# Patient Record
Sex: Female | Born: 1990 | Race: White | Hispanic: No | Marital: Single | Smoking: Never smoker
Health system: Southern US, Community
[De-identification: ages and names within clinical notes are randomized; demographics above are authoritative.]

## PROBLEM LIST (undated history)

## (undated) DIAGNOSIS — G43909 Migraine, unspecified, not intractable, without status migrainosus: Secondary | ICD-10-CM

## (undated) DIAGNOSIS — F419 Anxiety disorder, unspecified: Secondary | ICD-10-CM

---

## 2011-05-10 ENCOUNTER — Inpatient Hospital Stay (HOSPITAL_COMMUNITY): Payer: Medicaid Other

## 2011-05-10 ENCOUNTER — Inpatient Hospital Stay (HOSPITAL_COMMUNITY)
Admission: AD | Admit: 2011-05-10 | Discharge: 2011-05-10 | Disposition: A | Discharge status: Home / Self Care | Payer: Medicaid Other | Source: Ambulatory Visit | Attending: Obstetrics and Gynecology | Admitting: Obstetrics and Gynecology

## 2011-05-10 DIAGNOSIS — O479 False labor, unspecified: Secondary | ICD-10-CM | POA: Insufficient documentation

## 2011-05-12 ENCOUNTER — Inpatient Hospital Stay (HOSPITAL_COMMUNITY)
Admission: AD | Admit: 2011-05-12 | Discharge: 2011-05-15 | DRG: 775 | Disposition: A | Discharge status: Home / Self Care | Payer: Medicaid Other | Source: Ambulatory Visit | Attending: Family Medicine | Admitting: Family Medicine

## 2011-05-12 DIAGNOSIS — O480 Post-term pregnancy: Principal | ICD-10-CM | POA: Diagnosis present

## 2011-05-12 LAB — CBC
MCH: 30.4 pg (ref 26.0–34.0)
MCHC: 34.1 g/dL (ref 30.0–36.0)
Platelets: 144 10*3/uL — ABNORMAL LOW (ref 150–400)

## 2011-05-12 LAB — RPR: RPR Ser Ql: NONREACTIVE

## 2011-05-13 DIAGNOSIS — O480 Post-term pregnancy: Secondary | ICD-10-CM

## 2011-05-14 NOTE — H&P (Signed)
  Meghan Cline, Meghan Cline NO.:  [id number]  MEDICAL RECORD NO.:  [id number]  LOCATION:                                 FACILITY:  PHYSICIAN:  Tilda Burrow, M.D. DATE OF BIRTH:  Dec 11, 1990  DATE OF ADMISSION: DATE OF DISCHARGE:                             HISTORY & PHYSICAL   ADMISSION DIAGNOSIS:  Pregnancy at 41 weeks 1 day, postdates pregnancy, medically indicated induction of labor.  HISTORY OF PRESENT ILLNESS:  This 20 year old female gravida 1, para 0 with ultrasound assigned EDC of May 04, 2011, is admitted at 41 weeks 1 day for induction of labor.  Prenatal course has been followed through [facility] since 20 weeks' gestation.  She has had appropriate weight gain and fundal height growth.  Prenatal course was notable for positive Chlamydia culture x2, treated x2.  The partner, Nell Range initially refused to get treatment because [facility].  He then had treatment and proof of cure cultures were negative.  They attended childbirth classes.  Remainder of prenatal labs include blood type O positive, antibody screen negative, rubella immune present, hemoglobin 11, hematocrit 34, platelets 162,000.  Hepatitis, HIV, RPR, GC and Chlamydia all negative. HSV-2 is negative.  Most recent Chlamydia culture negative.  Glucose tolerance test normal.  She plans to take the baby to Dr. Milford Cage, it is a female.  She plans to breastfeed.  Future contraception is not documented.  PAST MEDICAL HISTORY:  Positive for constipation, hemorrhoids.  PAST SURGICAL HISTORY:  Negative.  ALLERGIES:  None.  SOCIAL HISTORY:  Single, lives with her aunt unemployed.  PHYSICAL EXAMINATION:  GENERAL:  Shows a healthy-appearing Caucasian female in no acute distress. VITAL SIGNS:  Weight 158, which is 30-pound weight gain, blood pressure 130/82.  Urinalysis negative for protein, glucose.  Fundal height 38 cm.  Cervix posterior, fingertip,  dilated 50% within the posteriorly oriented cervix and -2 station.  PLAN:  Admit for cervical ripening, possible Foley bulb use, and subsequent Pitocin induction of labor.     Tilda Burrow, M.D.     JVF/MEDQ  D:  05/07/2011  T:  05/08/2011  Job:  [id number]  cc:   Francoise Schaumann. Raynelle Highland Fax: [phone]  Electronically Signed by Christin Bach M.D. on 05/14/2011 07:34:43 PM

## 2015-12-15 ENCOUNTER — Encounter (HOSPITAL_COMMUNITY): Payer: Self-pay | Admitting: Emergency Medicine

## 2015-12-15 ENCOUNTER — Emergency Department (HOSPITAL_COMMUNITY)
Admission: EM | Admit: 2015-12-15 | Discharge: 2015-12-16 | Disposition: A | Discharge status: Home / Self Care | Payer: Medicaid Other | Attending: Emergency Medicine | Admitting: Emergency Medicine

## 2015-12-15 DIAGNOSIS — R509 Fever, unspecified: Secondary | ICD-10-CM | POA: Insufficient documentation

## 2015-12-15 DIAGNOSIS — G43809 Other migraine, not intractable, without status migrainosus: Secondary | ICD-10-CM | POA: Insufficient documentation

## 2015-12-15 DIAGNOSIS — Z3202 Encounter for pregnancy test, result negative: Secondary | ICD-10-CM | POA: Insufficient documentation

## 2015-12-15 DIAGNOSIS — F410 Panic disorder [episodic paroxysmal anxiety] without agoraphobia: Secondary | ICD-10-CM | POA: Insufficient documentation

## 2015-12-15 HISTORY — DX: Migraine, unspecified, not intractable, without status migrainosus: G43.909

## 2015-12-15 HISTORY — DX: Anxiety disorder, unspecified: F41.9
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
## 2015-12-15 NOTE — ED Notes (Addendum)
Patient states "I have been getting a headache and it is affecting my vision, then I have anxiety set it for the past 3 weeks. [facility] but I don't like the way they do things." States she was given a prescription for fioricet with no relief.

## 2015-12-16 ENCOUNTER — Emergency Department (HOSPITAL_COMMUNITY): Payer: Medicaid Other

## 2015-12-16 LAB — CBC WITH DIFFERENTIAL/PLATELET
BASOS ABS: 0 10*3/uL (ref 0.0–0.1)
BASOS PCT: 0 %
EOS ABS: 0.2 10*3/uL (ref 0.0–0.7)
EOS PCT: 3 %
HCT: 40.2 % (ref 36.0–46.0)
Hemoglobin: 13.1 g/dL (ref 12.0–15.0)
LYMPHS PCT: 38 %
Lymphs Abs: 3.2 10*3/uL (ref 0.7–4.0)
MCH: 27.3 pg (ref 26.0–34.0)
MCHC: 32.6 g/dL (ref 30.0–36.0)
MCV: 83.9 fL (ref 78.0–100.0)
MONO ABS: 0.4 10*3/uL (ref 0.1–1.0)
Monocytes Relative: 5 %
Neutro Abs: 4.6 10*3/uL (ref 1.7–7.7)
Neutrophils Relative %: 54 %
PLATELETS: 216 10*3/uL (ref 150–400)
RBC: 4.79 MIL/uL (ref 3.87–5.11)
RDW: 13.7 % (ref 11.5–15.5)
WBC: 8.4 10*3/uL (ref 4.0–10.5)

## 2015-12-16 LAB — BASIC METABOLIC PANEL
ANION GAP: 7 (ref 5–15)
BUN: 8 mg/dL (ref 6–20)
CALCIUM: 9.6 mg/dL (ref 8.9–10.3)
CO2: 25 mmol/L (ref 22–32)
Chloride: 107 mmol/L (ref 101–111)
Creatinine, Ser: 0.54 mg/dL (ref 0.44–1.00)
GFR calc Af Amer: >60 mL/min (ref >60)
GLUCOSE: 97 mg/dL (ref 65–99)
Potassium: 4.6 mmol/L (ref 3.5–5.1)
Sodium: 139 mmol/L (ref 135–145)

## 2015-12-16 LAB — TROPONIN I: Troponin I: <0.03 ng/mL (ref <0.031)

## 2015-12-16 LAB — POC URINE PREG, ED: Preg Test, Ur: NEGATIVE

## 2015-12-16 MED ORDER — SODIUM CHLORIDE 0.9 % IV BOLUS (SEPSIS)
1000.0000 mL | Freq: Once | INTRAVENOUS | Status: AC
  Administered 2015-12-16: 1000 mL via INTRAVENOUS

## 2015-12-16 MED ORDER — PROMETHAZINE HCL 25 MG/ML IJ SOLN
25.0000 mg | Freq: Once | INTRAMUSCULAR | Status: AC
  Administered 2015-12-16: 25 mg via INTRAVENOUS
  Filled 2015-12-16: qty 1

## 2015-12-16 MED ORDER — KETOROLAC TROMETHAMINE 30 MG/ML IJ SOLN
30.0000 mg | Freq: Once | INTRAMUSCULAR | Status: AC
  Administered 2015-12-16: 30 mg via INTRAVENOUS
  Filled 2015-12-16: qty 1

## 2015-12-16 MED ORDER — DIPHENHYDRAMINE HCL 50 MG/ML IJ SOLN
25.0000 mg | Freq: Once | INTRAMUSCULAR | Status: AC
  Administered 2015-12-16: 25 mg via INTRAVENOUS
  Filled 2015-12-16: qty 1

## 2015-12-16 MED ORDER — PROMETHAZINE HCL 25 MG PO TABS
25.0000 mg | ORAL_TABLET | Freq: Four times a day (QID) | ORAL | Status: AC | PRN
Start: 2015-12-16 — End: ?

## 2015-12-16 NOTE — Discharge Instructions (Signed)
Migraine Headache A migraine headache is an intense, throbbing pain on one or both sides of your head. A migraine can last for 30 minutes to several hours. CAUSES  The exact cause of a migraine headache is not always known. However, a migraine may be caused when nerves in the brain become irritated and release chemicals that cause inflammation. This causes pain. Certain things may also trigger migraines, such as:  Alcohol.  Smoking.  Stress.  Menstruation.  Aged cheeses.  Foods or drinks that contain nitrates, glutamate, aspartame, or tyramine.  Lack of sleep.  Chocolate.  Caffeine.  Hunger.  Physical exertion.  Fatigue.  Medicines used to treat chest pain (nitroglycerine), birth control pills, estrogen, and some blood pressure medicines. SIGNS AND SYMPTOMS  Pain on one or both sides of your head.  Pulsating or throbbing pain.  Severe pain that prevents daily activities.  Pain that is aggravated by any physical activity.  Nausea, vomiting, or both.  Dizziness.  Pain with exposure to bright lights, loud noises, or activity.  General sensitivity to bright lights, loud noises, or smells. Before you get a migraine, you may get warning signs that a migraine is coming (aura). An aura may include:  Seeing flashing lights.  Seeing bright spots, halos, or zigzag lines.  Having tunnel vision or blurred vision.  Having feelings of numbness or tingling.  Having trouble talking.  Having muscle weakness. DIAGNOSIS  A migraine headache is often diagnosed based on:  Symptoms.  Physical exam.  A CT scan or MRI of your head. These imaging tests cannot diagnose migraines, but they can help rule out other causes of headaches. TREATMENT Medicines may be given for pain and nausea. Medicines can also be given to help prevent recurrent migraines.  HOME CARE INSTRUCTIONS  Only take over-the-counter or prescription medicines for pain or discomfort as directed by your  health care provider. The use of long-term narcotics is not recommended.  Lie down in a dark, quiet room when you have a migraine.  Keep a journal to find out what may trigger your migraine headaches. For example, write down:  What you eat and drink.  How much sleep you get.  Any change to your diet or medicines.  Limit alcohol consumption.  Quit smoking if you smoke.  Get 7-9 hours of sleep, or as recommended by your health care provider.  Limit stress.  Keep lights dim if bright lights bother you and make your migraines worse. SEEK IMMEDIATE MEDICAL CARE IF:   Your migraine becomes severe.  You have a fever.  You have a stiff neck.  You have vision loss.  You have muscular weakness or loss of muscle control.  You start losing your balance or have trouble walking.  You feel faint or pass out.  You have severe symptoms that are different from your first symptoms. MAKE SURE YOU:   Understand these instructions.  Will watch your condition.  Will get help right away if you are not doing well or get worse.   This information is not intended to replace advice given to you by your health care provider. Make sure you discuss any questions you have with your health care provider.   Document Released: 11/15/2005 Document Revised: 12/06/2014 Document Reviewed: 07/23/2013 Elsevier Interactive Patient Education 2016 Elsevier Inc.  Panic Attacks Panic attacks are sudden, short-livedsurges of severe anxiety, fear, or discomfort. They may occur for no reason when you are relaxed, when you are anxious, or when you are sleeping. Panic attacks  may occur for a number of reasons:   Healthy people occasionally have panic attacks in extreme, life-threatening situations, such as war or natural disasters. Normal anxiety is a protective mechanism of the body that helps us react to danger (fight or flight response).  Panic attacks are often seen with anxiety disorders, such as panic  disorder, social anxiety disorder, generalized anxiety disorder, and phobias. Anxiety disorders cause excessive or uncontrollable anxiety. They may interfere with your relationships or other life activities.  Panic attacks are sometimes seen with other mental illnesses, such as depression and posttraumatic stress disorder.  Certain medical conditions, prescription medicines, and drugs of abuse can cause panic attacks. SYMPTOMS  Panic attacks start suddenly, peak within 20 minutes, and are accompanied by four or more of the following symptoms:  Pounding heart or fast heart rate (palpitations).  Sweating.  Trembling or shaking.  Shortness of breath or feeling smothered.  Feeling choked.  Chest pain or discomfort.  Nausea or strange feeling in your stomach.  Dizziness, light-headedness, or feeling like you will faint.  Chills or hot flushes.  Numbness or tingling in your lips or hands and feet.  Feeling that things are not real or feeling that you are not yourself.  Fear of losing control or going crazy.  Fear of dying. Some of these symptoms can mimic serious medical conditions. For example, you may think you are having a heart attack. Although panic attacks can be very scary, they are not life threatening. DIAGNOSIS  Panic attacks are diagnosed through an assessment by your health care provider. Your health care provider will ask questions about your symptoms, such as where and when they occurred. Your health care provider will also ask about your medical history and use of alcohol and drugs, including prescription medicines. Your health care provider may order blood tests or other studies to rule out a serious medical condition. Your health care provider may refer you to a mental health professional for further evaluation. TREATMENT   Most healthy people who have one or two panic attacks in an extreme, life-threatening situation will not require treatment.  The treatment for  panic attacks associated with anxiety disorders or other mental illness typically involves counseling with a mental health professional, medicine, or a combination of both. Your health care provider will help determine what treatment is best for you.  Panic attacks due to physical illness usually go away with treatment of the illness. If prescription medicine is causing panic attacks, talk with your health care provider about stopping the medicine, decreasing the dose, or substituting another medicine.  Panic attacks due to alcohol or drug abuse go away with abstinence. Some adults need professional help in order to stop drinking or using drugs. HOME CARE INSTRUCTIONS   Take all medicines as directed by your health care provider.   Schedule and attend follow-up visits as directed by your health care provider. It is important to keep all your appointments. SEEK MEDICAL CARE IF:  You are not able to take your medicines as prescribed.  Your symptoms do not improve or get worse. SEEK IMMEDIATE MEDICAL CARE IF:   You experience panic attack symptoms that are different than your usual symptoms.  You have serious thoughts about hurting yourself or others.  You are taking medicine for panic attacks and have a serious side effect. MAKE SURE YOU:  Understand these instructions.  Will watch your condition.  Will get help right away if you are not doing well or get worse.  This information is not intended to replace advice given to you by your health care provider. Make sure you discuss any questions you have with your health care provider.   Document Released: 11/15/2005 Document Revised: 11/20/2013 Document Reviewed: 06/29/2013 Elsevier Interactive Patient Education 2016 Elsevier Inc. Outpatient Psychiatry and Counseling  [facility]: Mobile Crisis Management 24 hours:  [phone]  [facility] of the [employer] sliding scale fee and walk in schedule: M-F  8am-12pm/1pm-3pm [address]  [place], [place] [address] [phone]  [facility] [address] [place], [place] [address] [phone]  [facility] ([facility] [employer])- new patient walk-in appointments available Monday - Friday 8am -3pm.          [address] [place], [place] [address] [phone] or crisis line- [phone]  [facility] Outpatient Services/ Intensive Outpatient Therapy Program [address] [place], [place] [address] [phone]  [facility]                  Crisis Services      [phone] [address]     [place], [place] [address]                 [facility]   [facility] [phone]. [address] [place], [place] [address]   [employer] of Care          [address] [place]  [place], [place] [address]       [phone]  [facility] [address] [place], [place] [address] [phone]  [facility]    [address]    [place], [place] [address]     [phone]       Andee Poles, MD     [address]     [place] [address]     [phone]       [facility] [address] [place] [address]  [facility]     [address] [place], [place]      [phone]       [facility] Eulogio Ditch, MD [address] [place], [place] [address] [phone]  [facility]     [address]     [place], [place] [address]     [phone]       [facility] [address] [place], [place] [address] [phone] Resources for Temporary Residential Assistance/Crisis Centers  [facility]) M-F 8am-3pm   [address] [place], [place] [address]   [phone] Services include: laundry, barbering, support  groups, case management, phone  & computer access, showers, AA/NA mtgs, mental health/substance abuse nurse, job skills class, disability information, VA assistance, spiritual classes, etc.   HOMELESS SHELTERS  [facility]     [facility]   [address] [place]     [phone]              [employer] (women and children)       [address] [place], [place] [address] [phone] [email or web address] for application and process Application Required  [facility]   [address]    [place] [address]     [phone]  [place], [place] [address] [phone]([email or web address] application appt.) Application Required  [facility] (women only)    [address]     [place], [place] [address]     [phone]      Intake starts 6pm daily Need valid ID, SSC, & Police report [employer] [address] [place], [place] [phone] Application Required  [employer] (men only)     [place].      [place], [place]       [facility] [place] (Pregnant women only) [address] [place], [place]  [facility]      [place] [address]      [place], [place] [address]     [phone]             [facility] [address] [place], [place] 90 day commitment/SA/Application process  [facility](men only)     [address]     [place], [place]       Check-in at [facility] [address] [place],  [address] [phone] Men/Women/Women and Children must be there by 7 pm  [place], [place]

## 2016-06-14 IMAGING — DX DG CHEST 2V
2 series · 2 of 2 positions shown · non-contrast
Comparison: None.

CLINICAL DATA: 24-year-old female with central chest pain

EXAM:
CHEST  2 VIEW

[chest pa]
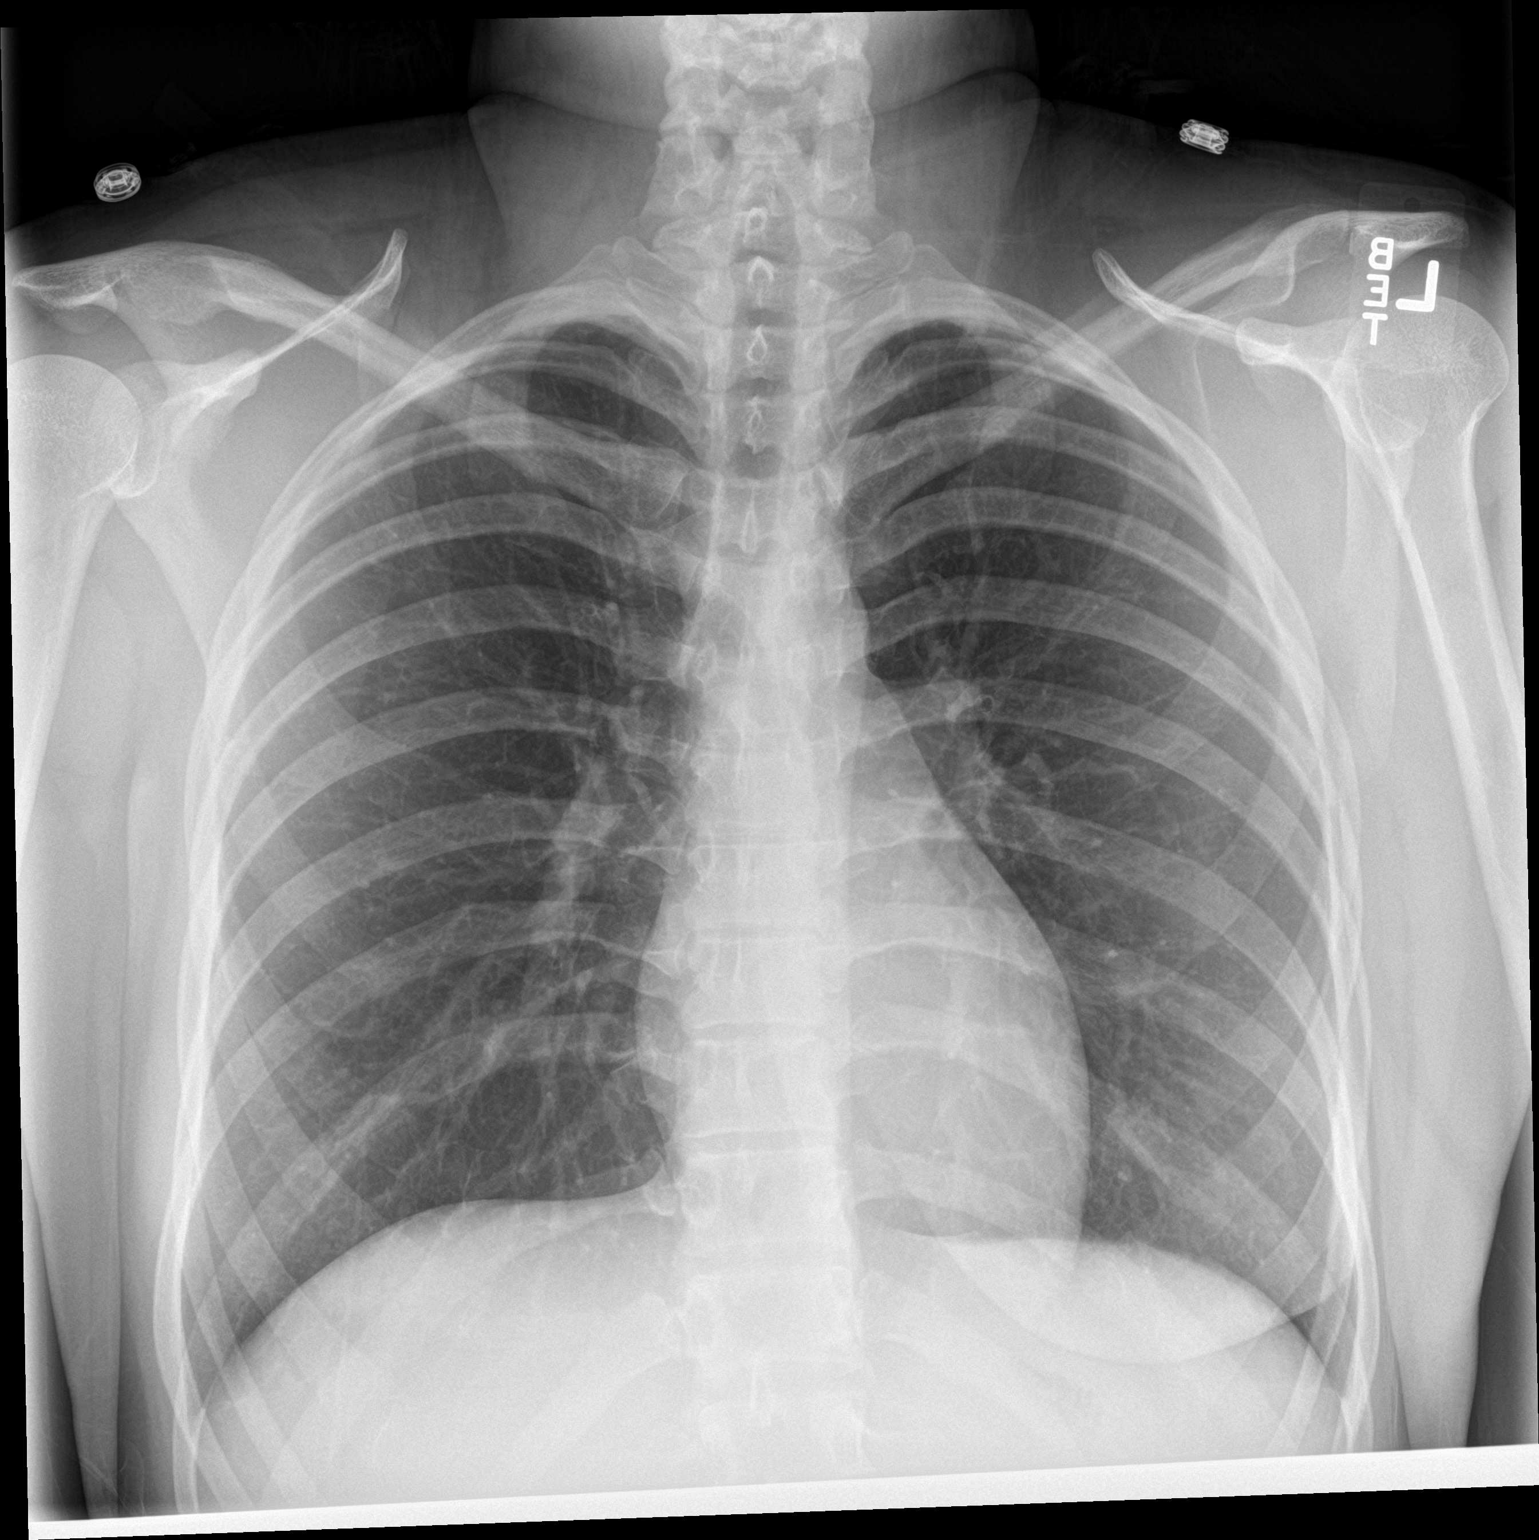

[chest lat]
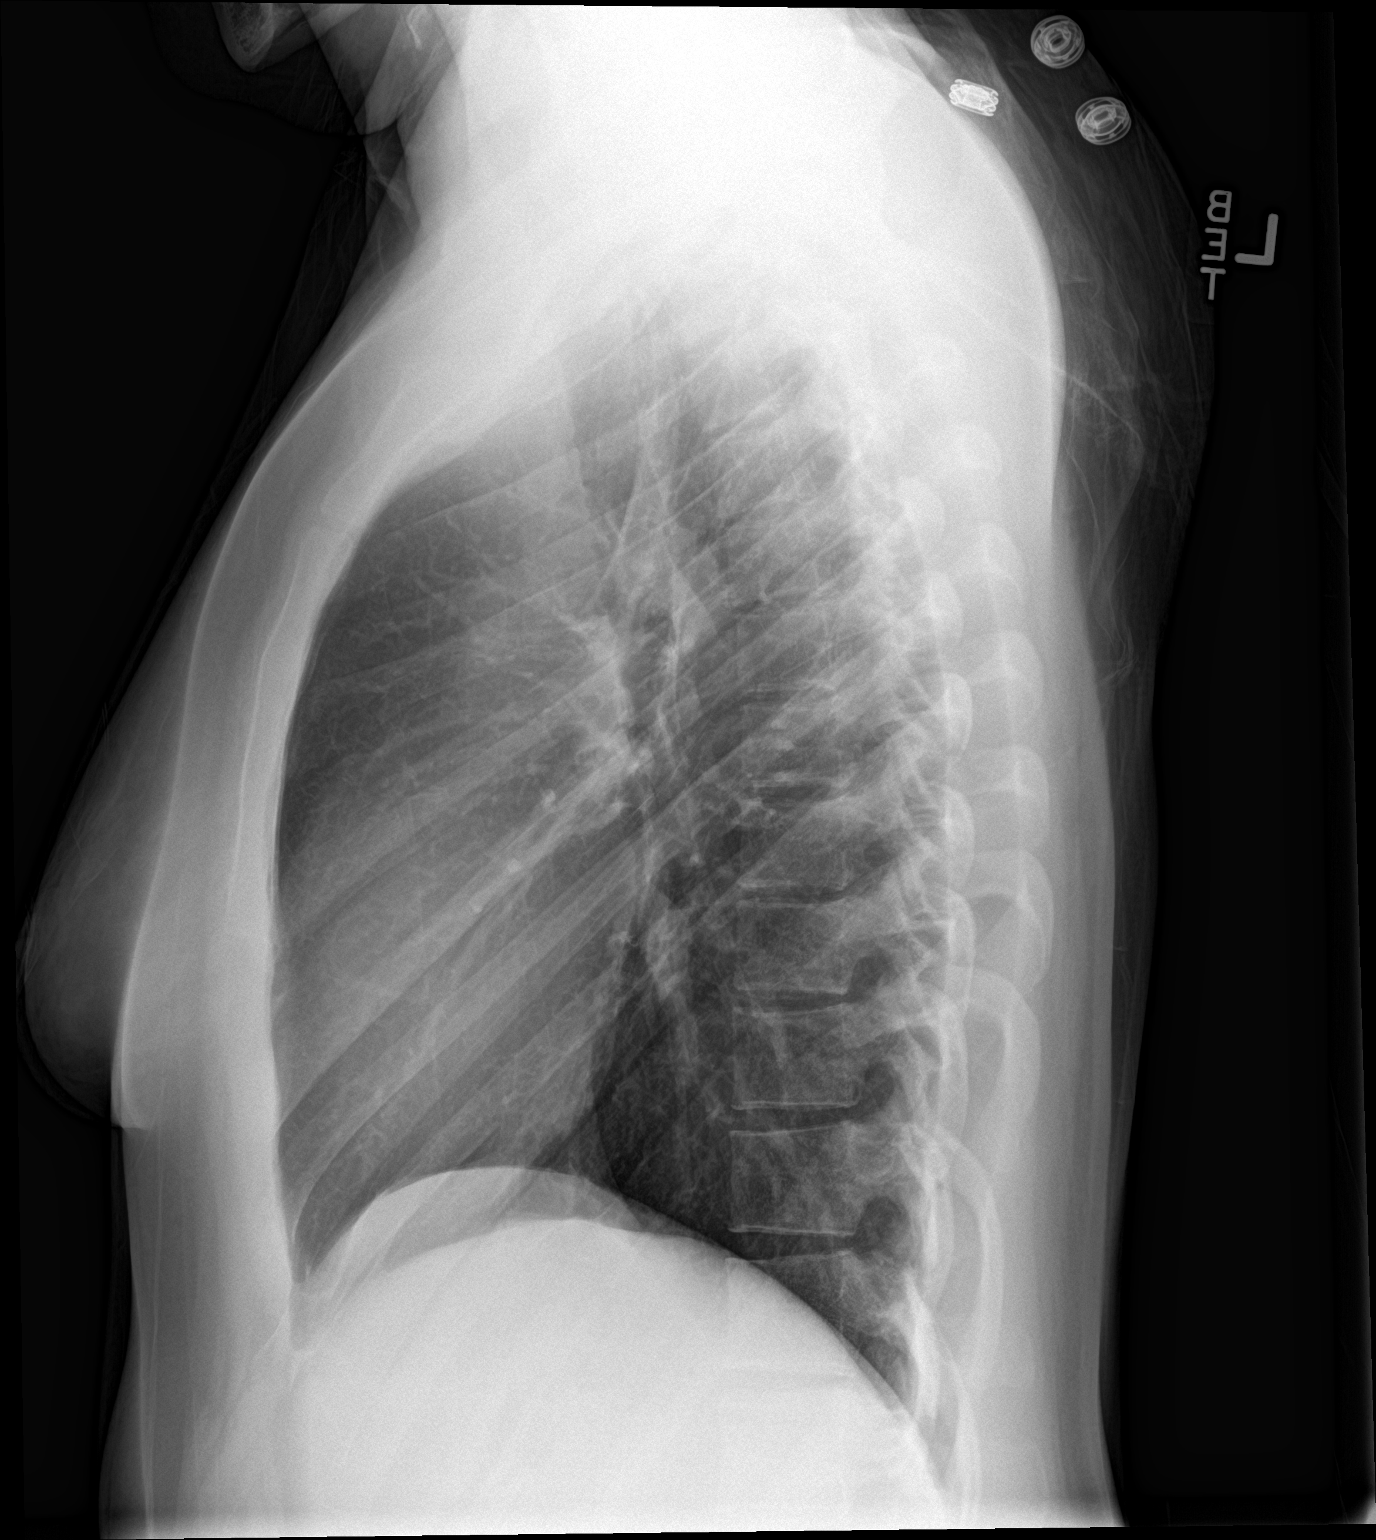

[2 of 2 positions shown; findings below may reference images not displayed]

FINDINGS: The heart size and mediastinal contours are within normal limits.
Both lungs are clear. The visualized skeletal structures are
unremarkable.
IMPRESSION: No active cardiopulmonary disease.
